# Patient Record
Sex: Male | Born: 2000 | Race: Black or African American | Hispanic: No | Marital: Single | Smoking: Never smoker
Health system: Southern US, Community
[De-identification: ages and names within clinical notes are randomized; demographics above are authoritative.]

---
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
## 2020-05-06 IMAGING — CR DG HAND COMPLETE 3+V*R*
3 series · 3 of 3 positions shown · non-contrast
Comparison: None.

CLINICAL DATA: Injury while catching a ball

EXAM:
RIGHT HAND - COMPLETE 3+ VIEW

[x hand pa right]
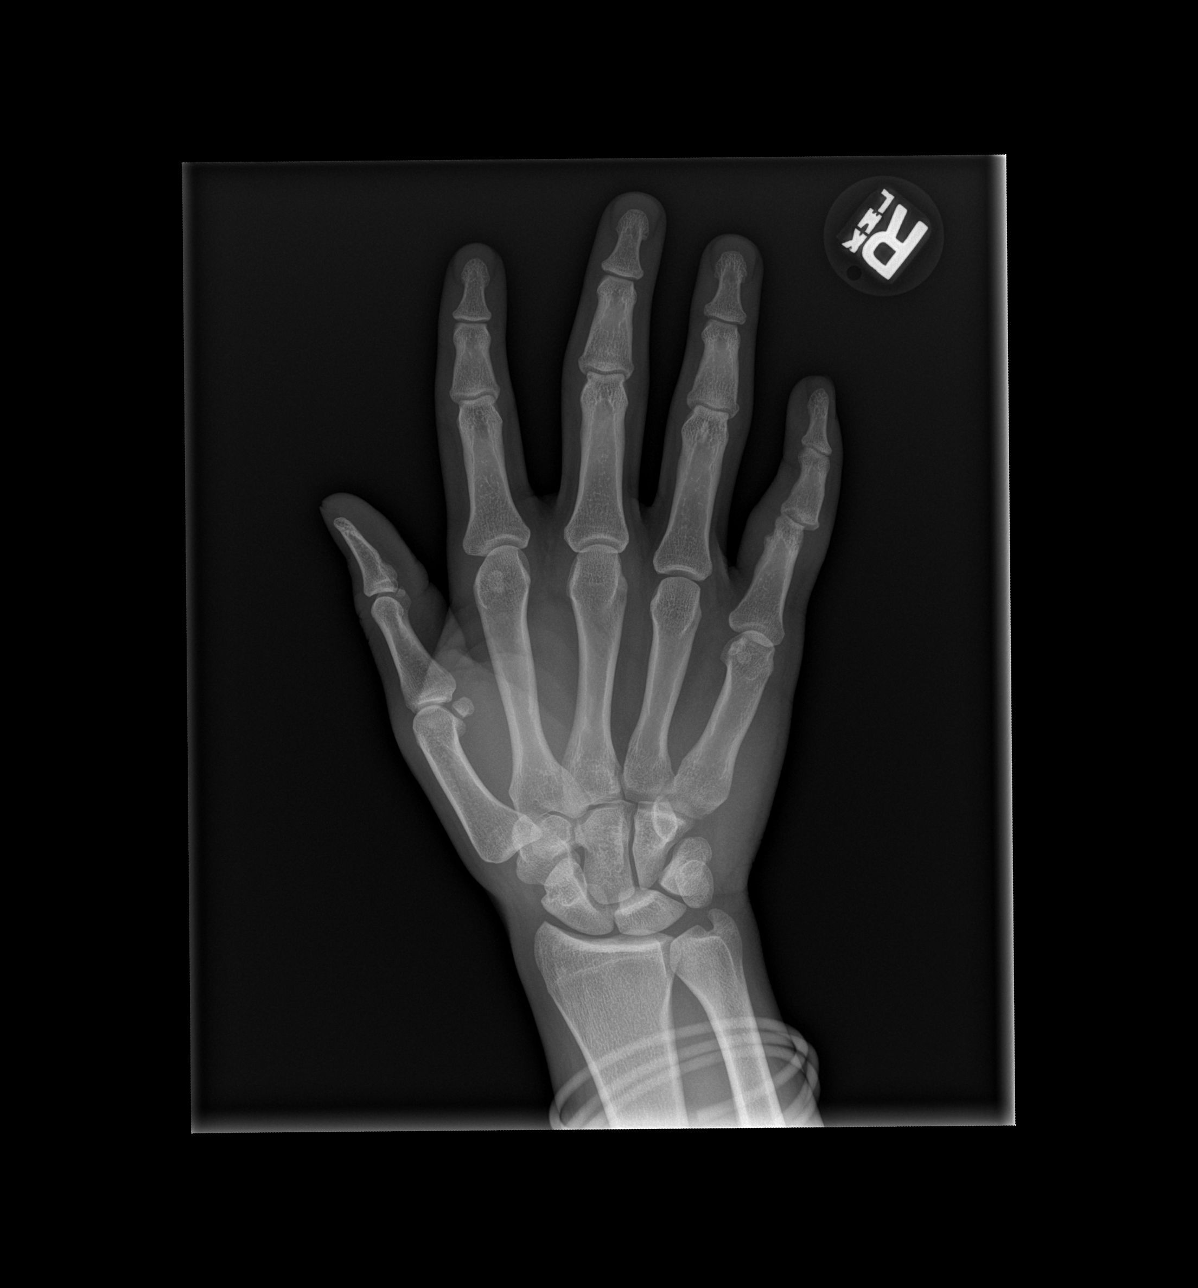

[x hand obl right]
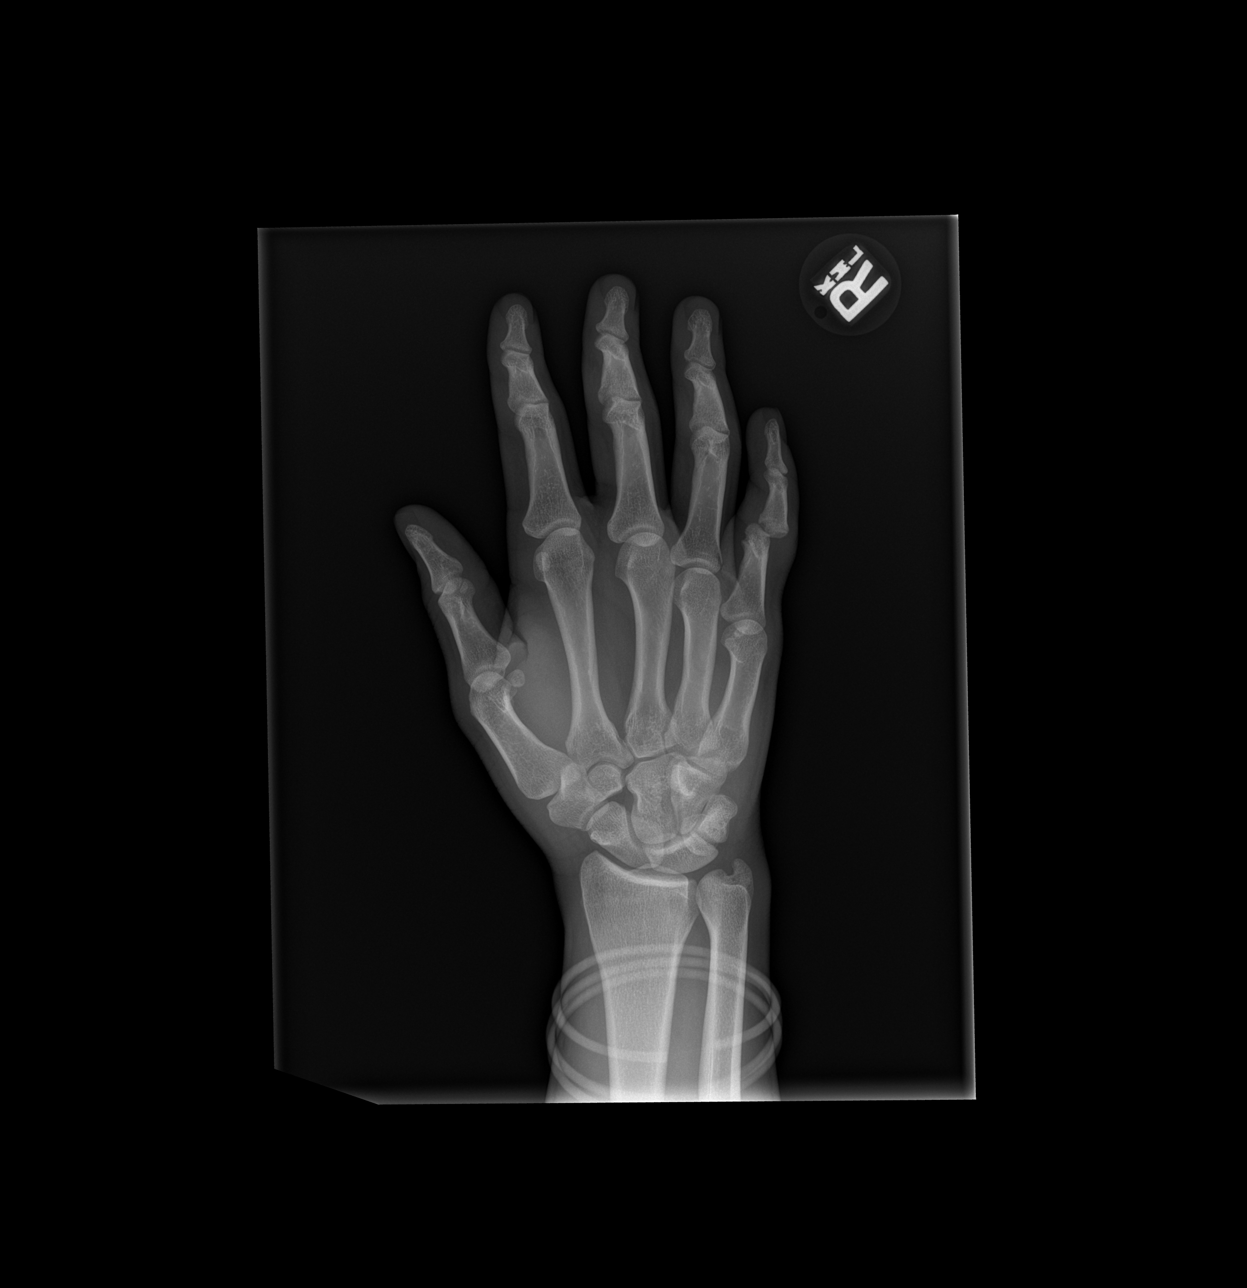

[x hand lat right]
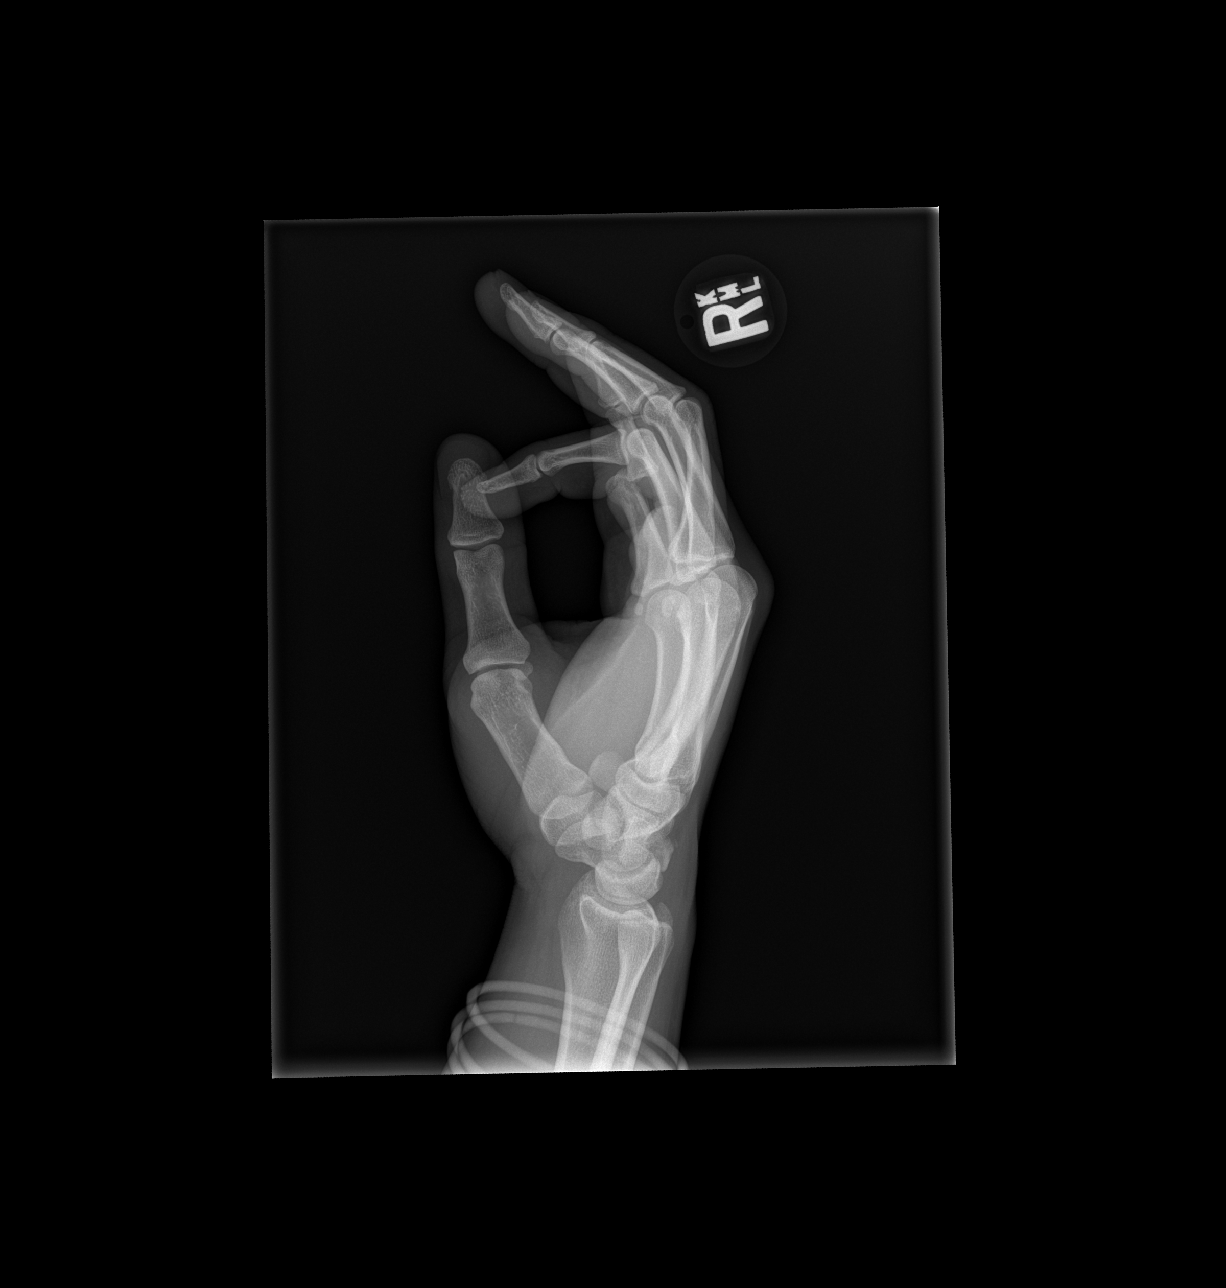

[3 of 3 positions shown; findings below may reference images not displayed]

FINDINGS: Frontal, oblique, and lateral views were obtained. There is
dislocation at the fifth PIP joint. The middle and distal phalanges
are displaced slightly dorsal and medial to the proximal phalanx.
There is a fracture involving the distal aspect of the fifth
proximal phalanx with alignment near anatomic. There also appears to
be a fracture along the proximal aspect of the fifth middle phalanx
at the proximal physeal level. No other fractures. No other
dislocations. Joint spaces appear normal. No erosive change.
IMPRESSION: Dislocation at the fifth PIP joint. Fractures of the distal aspect
of the fifth proximal phalanx and proximal aspect of the fifth
middle phalanx with alignment near anatomic in these areas. No other
fractures are evident. No dislocations. No appreciable arthropathic
change.
